# Patient Record
Sex: Male | Born: 1970
Health system: Southern US, Community
[De-identification: ages and names within clinical notes are randomized; demographics above are authoritative.]

## PROBLEM LIST (undated history)

## (undated) DIAGNOSIS — N200 Calculus of kidney: Secondary | ICD-10-CM

## (undated) DIAGNOSIS — T7840XA Allergy, unspecified, initial encounter: Secondary | ICD-10-CM

## (undated) HISTORY — DX: Allergy, unspecified, initial encounter: T78.40XA

---

## 2014-08-13 DIAGNOSIS — N200 Calculus of kidney: Secondary | ICD-10-CM

## 2014-08-13 HISTORY — DX: Calculus of kidney: N20.0

## 2014-08-13 HISTORY — PX: EXTRACORPOREAL SHOCK WAVE LITHOTRIPSY: SHX1557

## 2014-08-26 ENCOUNTER — Emergency Department
Admit: 2014-08-26 | Disposition: A | Discharge status: Home / Self Care | Payer: Self-pay | Admitting: Emergency Medicine

## 2014-08-26 ENCOUNTER — Ambulatory Visit
Admit: 2014-08-26 | Disposition: A | Discharge status: Home / Self Care | Payer: Self-pay | Attending: Family Medicine | Admitting: Family Medicine

## 2014-08-26 LAB — COMPREHENSIVE METABOLIC PANEL
ANION GAP: 4 — AB (ref 7–16)
Albumin: 4.2 g/dL
Alkaline Phosphatase: 67 U/L
BILIRUBIN TOTAL: 1 mg/dL
BUN: 13 mg/dL
CO2: 29 mmol/L
CREATININE: 1.09 mg/dL
Calcium, Total: 8.7 mg/dL — ABNORMAL LOW
Chloride: 106 mmol/L
Glucose: 93 mg/dL
POTASSIUM: 4 mmol/L
SGOT(AST): 29 U/L
SGPT (ALT): 31 U/L
Sodium: 139 mmol/L
Total Protein: 7.6 g/dL

## 2014-08-26 LAB — URINALYSIS, COMPLETE
BACTERIA: NONE SEEN
Bilirubin,UR: NEGATIVE
Glucose,UR: NEGATIVE mg/dL (ref 0–75)
NITRITE: NEGATIVE
PROTEIN: NEGATIVE
Ph: 5 (ref 4.5–8.0)
SPECIFIC GRAVITY: 1.021 (ref 1.003–1.030)

## 2014-08-26 LAB — CBC
HCT: 46.8 % (ref 40.0–52.0)
HGB: 15.2 g/dL (ref 13.0–18.0)
MCH: 28.8 pg (ref 26.0–34.0)
MCHC: 32.5 g/dL (ref 32.0–36.0)
MCV: 89 fL (ref 80–100)
Platelet: 177 10*3/uL (ref 150–440)
RBC: 5.27 10*6/uL (ref 4.40–5.90)
RDW: 13.6 % (ref 11.5–14.5)
WBC: 8.3 10*3/uL (ref 3.8–10.6)

## 2014-08-28 LAB — URINE CULTURE

## 2014-09-02 ENCOUNTER — Ambulatory Visit
Admit: 2014-09-02 | Disposition: A | Discharge status: Home / Self Care | Payer: Self-pay | Attending: Urology | Admitting: Urology

## 2014-10-24 ENCOUNTER — Emergency Department
Admission: EM | Admit: 2014-10-24 | Discharge: 2014-10-24 | Disposition: A | Discharge status: Home / Self Care | Payer: No Typology Code available for payment source | Attending: Emergency Medicine | Admitting: Emergency Medicine

## 2014-10-24 ENCOUNTER — Emergency Department: Payer: No Typology Code available for payment source

## 2014-10-24 ENCOUNTER — Encounter: Payer: Self-pay | Admitting: General Practice

## 2014-10-24 DIAGNOSIS — N200 Calculus of kidney: Secondary | ICD-10-CM | POA: Diagnosis not present

## 2014-10-24 DIAGNOSIS — R1032 Left lower quadrant pain: Secondary | ICD-10-CM | POA: Diagnosis present

## 2014-10-24 HISTORY — DX: Calculus of kidney: N20.0

## 2014-10-24 LAB — COMPREHENSIVE METABOLIC PANEL
ALBUMIN: 4.2 g/dL (ref 3.5–5.0)
ALT: 38 U/L (ref 17–63)
ANION GAP: 9 (ref 5–15)
AST: 38 U/L (ref 15–41)
Alkaline Phosphatase: 65 U/L (ref 38–126)
BUN: 19 mg/dL (ref 6–20)
CALCIUM: 9.4 mg/dL (ref 8.9–10.3)
CHLORIDE: 110 mmol/L (ref 101–111)
CO2: 23 mmol/L (ref 22–32)
Creatinine, Ser: 1.24 mg/dL (ref 0.61–1.24)
GFR calc non Af Amer: >60 mL/min (ref >60)
Glucose, Bld: 128 mg/dL — ABNORMAL HIGH (ref 65–99)
POTASSIUM: 3.7 mmol/L (ref 3.5–5.1)
Sodium: 142 mmol/L (ref 135–145)
Total Bilirubin: 0.7 mg/dL (ref 0.3–1.2)
Total Protein: 7.3 g/dL (ref 6.5–8.1)

## 2014-10-24 LAB — URINALYSIS COMPLETE WITH MICROSCOPIC (ARMC ONLY)
Bacteria, UA: NONE SEEN
Bilirubin Urine: NEGATIVE
Glucose, UA: NEGATIVE mg/dL
KETONES UR: NEGATIVE mg/dL
LEUKOCYTES UA: NEGATIVE
Nitrite: NEGATIVE
PROTEIN: NEGATIVE mg/dL
SQUAMOUS EPITHELIAL / LPF: NONE SEEN
Specific Gravity, Urine: 1.028 (ref 1.005–1.030)
pH: 5 (ref 5.0–8.0)

## 2014-10-24 LAB — CBC
HCT: 47.3 % (ref 40.0–52.0)
HEMOGLOBIN: 15.7 g/dL (ref 13.0–18.0)
MCH: 29.1 pg (ref 26.0–34.0)
MCHC: 33.2 g/dL (ref 32.0–36.0)
MCV: 87.7 fL (ref 80.0–100.0)
PLATELETS: 182 10*3/uL (ref 150–440)
RBC: 5.4 MIL/uL (ref 4.40–5.90)
RDW: 14.3 % (ref 11.5–14.5)
WBC: 8.1 10*3/uL (ref 3.8–10.6)

## 2014-10-24 MED ORDER — OXYCODONE-ACETAMINOPHEN 5-325 MG PO TABS
1.0000 | ORAL_TABLET | Freq: Once | ORAL | Status: AC
  Administered 2014-10-24: 1 via ORAL

## 2014-10-24 MED ORDER — OXYCODONE-ACETAMINOPHEN 5-325 MG PO TABS
ORAL_TABLET | ORAL | Status: AC
  Filled 2014-10-24: qty 1

## 2014-10-24 MED ORDER — OXYCODONE-ACETAMINOPHEN 7.5-325 MG PO TABS: 1.0000 | ORAL_TABLET | Freq: Four times a day (QID) | ORAL | Status: DC | PRN

## 2014-10-24 NOTE — ED Provider Notes (Signed)
[facility] Emergency Department Provider Note  ____________________________________________  Time seen: Approximately 8:01 PM  I have reviewed the triage vital signs and the nursing notes.   HISTORY  Chief Complaint Abdominal Pain and Urinary Retention    HPI Brian Wilcox is a 44 y.o. male patient complaining of sudden onset to the left lower quadrant this afternoon. Patient also notes decreased urine stream at this time. She had strong history of kidney stones. Patient rates his pain as a 10 over 10. Patient denies any nausea vomiting with this complaint. States there is no fever or flank pain at this time   Past Medical History  Diagnosis Date  . Kidney stone     There are no active problems to display for this patient.   History reviewed. No pertinent past surgical history.  No current outpatient prescriptions on file.  Allergies Review of patient's allergies indicates no known allergies.  No family history on file.  Social History History  Substance Use Topics  . Smoking status: Never Smoker   . Smokeless tobacco: Never Used  . Alcohol Use: No    Review of Systems Constitutional: No fever/chills Eyes: No visual changes. ENT: No sore throat. Cardiovascular: Denies chest pain. Respiratory: Denies shortness of breath. Gastrointestinal: No abdominal pain.  No nausea, no vomiting.  No diarrhea.  No constipation. Genitourinary: Decreased urination and left lower quadrant pain Musculoskeletal: Negative for back pain. Skin: Negative for rash. Neurological: Negative for headaches, focal weakness or numbness. 10-point ROS otherwise negative.  ____________________________________________   PHYSICAL EXAM:  VITAL SIGNS: ED Triage Vitals  Enc Vitals Group     BP 10/24/14 1745 150/95 mmHg     Pulse Rate 10/24/14 1745 69     Resp 10/24/14 1745 18     Temp --      Temp Source 10/24/14 1745 Oral     SpO2 10/24/14 1745 99 %      Weight 10/24/14 1745 229 lb (103.874 kg)     Height 10/24/14 1745 6' (1.829 m)     Head Cir --      Peak Flow --      Pain Score 10/24/14 1746 10     Pain Loc --      Pain Edu? --      Excl. in GC? --     Constitutional: Alert and oriented. Well appearing and in mild distress. Eyes: Conjunctivae are normal. PERRL. EOMI. Head: Atraumatic. Nose: No congestion/rhinnorhea. Mouth/Throat: Mucous membranes are moist.  Oropharynx non-erythematous. Neck: No stridor. No deformity for nuchal range of motion nontender palpation. Hematological/Lymphatic/Immunilogical: No cervical lymphadenopathy. Cardiovascular: Normal rate, regular rhythm. Grossly normal heart sounds.  Good peripheral circulation. Mild elevation of blood pressure. Respiratory: Normal respiratory effort.  No retractions. Lungs CTAB. Gastrointestinal: Soft and mild guarding left lower quadrant. No distention. No abdominal bruits. No CVA tenderness. Genitourinary: Hematuria Musculoskeletal: No lower extremity tenderness nor edema.  No joint effusions. Neurologic:  Normal speech and language. No gross focal neurologic deficits are appreciated. Speech is normal. No gait instability. Skin:  Skin is warm, dry and intact. No rash noted. Psychiatric: Mood and affect are normal. Speech and behavior are normal.  ____________________________________________   LABS (all labs ordered are listed, but only abnormal results are displayed)  Labs Reviewed  URINALYSIS COMPLETEWITH MICROSCOPIC ([facility] ONLY) - Abnormal; Notable for the following:    Color, Urine YELLOW (*)    APPearance CLEAR (*)    Hgb urine dipstick 2+ (*)  All other components within normal limits  COMPREHENSIVE METABOLIC PANEL - Abnormal; Notable for the following:    Glucose, Bld 128 (*)    All other components within normal limits  CBC    ____________________________________________  EKG   ____________________________________________  RADIOLOGY   ____________________________________________   PROCEDURES  Procedure(s) performed: None  Critical Care performed: No  ____________________________________________   INITIAL IMPRESSION / ASSESSMENT AND PLAN / ED COURSE  Pertinent labs & imaging results that were available during my care of the patient were reviewed by me and considered in my medical decision making (see chart for details).  Hematuria with CT scan abdomen and pelvis for kidney stone. ____________________________________________   FINAL CLINICAL IMPRESSION(S) / ED DIAGNOSES  Final diagnoses:  Kidney stone on left side      TAMIM SKOG, PA-C 10/24/14 2110  Sharyn Creamer, MD 10/25/14 [phone]

## 2014-10-24 NOTE — Discharge Instructions (Signed)
Kidney Stones °Kidney stones (urolithiasis) are solid masses that form inside your kidneys. The intense pain is caused by the stone moving through the kidney, ureter, bladder, and urethra (urinary tract). When the stone moves, the ureter starts to spasm around the stone. The stone is usually passed in your pee (urine).  °HOME CARE °· Drink enough fluids to keep your pee clear or pale yellow. This helps to get the stone out. °· Strain all pee through the provided strainer. Do not pee without peeing through the strainer, not even once. If you pee the stone out, catch it in the strainer. The stone may be as small as a grain of salt. Take this to your doctor. This will help your doctor figure out what you can do to try to prevent more kidney stones. °· Only take medicine as told by your doctor. °· Follow up with your doctor as told. °· Get follow-up X-rays as told by your doctor. °GET HELP IF: °You have pain that gets worse even if you have been taking pain medicine. °GET HELP RIGHT AWAY IF:  °· Your pain does not get better with medicine. °· You have a fever or shaking chills. °· Your pain increases and gets worse over 18 hours. °· You have new belly (abdominal) pain. °· You feel faint or pass out. °· You are unable to pee. °MAKE SURE YOU:  °· Understand these instructions. °· Will watch your condition. °· Will get help right away if you are not doing well or get worse. °Document Released: 10/17/2007 Document Revised: 12/31/2012 Document Reviewed: 10/01/2012 °[employer]® Patient Information ©2015 [employer]. This information is not intended to replace advice given to you by your health care provider. Make sure you discuss any questions you have with your health care provider. ° °Lithotripsy for Kidney Stones °Lithotripsy is a treatment that can sometimes help eliminate kidney stones and pain that they cause. A form of lithotripsy, also known as extracorporeal shock wave lithotripsy, is a nonsurgical procedure that  helps your body rid itself of the kidney stone when it is too big to pass on its own. Extracorporeal shock wave lithotripsy is a method of crushing a kidney stone with shock waves. These shock waves pass through your body and are focused on your stone. They cause the kidney stones to crumble while still in the urinary tract. It is then easier for the smaller pieces of stone to pass in the urine. °Lithotripsy usually takes about an hour. It is done in a hospital, a lithotripsy center, or a mobile unit. It usually does not require an overnight stay. Your health care provider will instruct you on preparation for the procedure. Your health care provider will tell you what to expect afterward. °LET YOUR HEALTH CARE PROVIDER KNOW ABOUT: °· Any allergies you have. °· All medicines you are taking, including vitamins, herbs, eye drops, creams, and over-the-counter medicines. °· Previous problems you or members of your family have had with the use of anesthetics. °· Any blood disorders you have. °· Previous surgeries you have had. °· Medical conditions you have. °RISKS AND COMPLICATIONS °Generally, lithotripsy for kidney stones is a safe procedure. However, as with any procedure, complications can occur. Possible complications include: °· Infection. °· Bleeding of the kidney. °· Bruising of the kidney or skin. °· Obstruction of the ureter. °· Failure of the stone to fragment. °BEFORE THE PROCEDURE °· Do not eat or drink for 6-8 hours prior to the procedure. You may, however, take the medications   with a sip of water that your physician instructs you to take °· Do not take aspirin or aspirin-containing products for 7 days prior to your procedure °· Do not take nonsteroidal anti-inflammatory products for 7 days prior to your procedure °PROCEDURE °A stent (flexible tube with holes) may be placed in your ureter. The ureter is the tube that transports the urine from the kidneys to the bladder. Your health care provider may place a  stent before the procedure. This will help keep urine flowing from the kidney if the fragments of the stone block the ureter. You may have an IV tube placed in one of your veins to give you fluids and medicines. These medicines may help you relax or make you sleep. During the procedure, you will lie comfortably on a fluid-filled cushion or in a warm-water bath. After an X-ray or ultrasound exam to locate your stone, shock waves are aimed at the stone. If you are awake, you may feel a tapping sensation as the shock waves pass through your body. If large stone particles remain after treatment, a second procedure may be necessary at a later date. °For comfort during the test: °· Relax as much as possible. °· Try to remain still as much as possible. °· Try to follow instructions to speed up the test. °· Let your health care provider know if you are uncomfortable, anxious, or in pain. °AFTER THE PROCEDURE  °After surgery, you will be taken to the recovery area. A nurse will watch and check your progress. Once you're awake, stable, and taking fluids well, you will be allowed to go home as long as there are no problems. You will also be allowed to pass your urine before discharge. You may be given antibiotics to help prevent infection. You may also be prescribed pain medicine if needed. In a week or two, your health care provider may remove your stent, if you have one. You may first have an X-ray exam to check on how successful the fragmentation of your stone has been and how much of the stone has passed. Your health care provider will check to see whether or not stone particles remain. °SEEK IMMEDIATE MEDICAL CARE IF: °· You develop a fever or shaking chills. °· Your pain is not relieved by medicine. °· You feel sick to your stomach (nauseated) and you vomit. °· You develop heavy bleeding. °· You have difficulty urinating. °· You start to pass your stent from your penis. °Document Released: 04/27/2000 Document Revised:  02/18/2013 Document Reviewed: 11/13/2012 °[employer]® Patient Information ©2015 [employer]. This information is not intended to replace advice given to you by your health care provider. Make sure you discuss any questions you have with your health care provider. ° °

## 2014-10-24 NOTE — ED Notes (Signed)
Pt alert and in NAD at time of d/c 

## 2014-10-24 NOTE — ED Notes (Signed)
Pt. Arrived to ed from home with reports of sudden onset of LLQ pain that started suddenly this afternoon. Pt has hx of kidney stones. PT states "i think i might have another one". Pt describes decrease urination at this time. Alert and Oriented. Diaphoretic in triage.

## 2014-12-01 ENCOUNTER — Encounter: Payer: Self-pay | Admitting: *Deleted

## 2014-12-02 ENCOUNTER — Encounter
Admission: RE | Disposition: A | Discharge status: Home / Self Care | Payer: Self-pay | Source: Ambulatory Visit | Attending: Urology

## 2014-12-02 ENCOUNTER — Ambulatory Visit
Admission: RE | Admit: 2014-12-02 | Discharge: 2014-12-02 | Disposition: A | Discharge status: Home / Self Care | Payer: No Typology Code available for payment source | Source: Ambulatory Visit | Attending: Urology | Admitting: Urology

## 2014-12-02 ENCOUNTER — Encounter: Payer: Self-pay | Admitting: *Deleted

## 2014-12-02 DIAGNOSIS — Z79899 Other long term (current) drug therapy: Secondary | ICD-10-CM | POA: Insufficient documentation

## 2014-12-02 DIAGNOSIS — N201 Calculus of ureter: Secondary | ICD-10-CM | POA: Insufficient documentation

## 2014-12-02 DIAGNOSIS — N200 Calculus of kidney: Secondary | ICD-10-CM

## 2014-12-02 HISTORY — PX: EXTRACORPOREAL SHOCK WAVE LITHOTRIPSY: SHX1557

## 2014-12-02 SURGERY — LITHOTRIPSY, ESWL
Anesthesia: Moderate Sedation | Laterality: Left

## 2014-12-02 MED ORDER — PROMETHAZINE HCL 25 MG/ML IJ SOLN
INTRAMUSCULAR | Status: AC
Start: 2014-12-02 — End: 2014-12-02
  Administered 2014-12-02: 25 mg via INTRAMUSCULAR
  Filled 2014-12-02: qty 1

## 2014-12-02 MED ORDER — DEXTROSE-NACL 5-0.45 % IV SOLN
INTRAVENOUS | Status: DC
  Administered 2014-12-02: 08:00:00 via INTRAVENOUS

## 2014-12-02 MED ORDER — DIPHENHYDRAMINE HCL 25 MG PO CAPS
25.0000 mg | ORAL_CAPSULE | ORAL | Status: AC
  Administered 2014-12-02: 25 mg via ORAL

## 2014-12-02 MED ORDER — LEVOFLOXACIN 500 MG PO TABS
ORAL_TABLET | ORAL | Status: AC
Start: 2014-12-02 — End: 2014-12-02
  Administered 2014-12-02: 500 mg via ORAL
  Filled 2014-12-02: qty 1

## 2014-12-02 MED ORDER — FUROSEMIDE 10 MG/ML IJ SOLN
INTRAMUSCULAR | Status: AC
  Administered 2014-12-02: 10 mg
  Filled 2014-12-02: qty 2

## 2014-12-02 MED ORDER — MIDAZOLAM HCL 2 MG/2ML IJ SOLN
INTRAMUSCULAR | Status: AC
  Administered 2014-12-02: 1 mg via INTRAMUSCULAR
  Filled 2014-12-02: qty 2

## 2014-12-02 MED ORDER — MORPHINE SULFATE 10 MG/ML IJ SOLN
INTRAMUSCULAR | Status: AC
  Administered 2014-12-02: 10 mg via INTRAMUSCULAR
  Filled 2014-12-02: qty 1

## 2014-12-02 MED ORDER — DIPHENHYDRAMINE HCL 25 MG PO CAPS
ORAL_CAPSULE | ORAL | Status: AC
  Administered 2014-12-02: 25 mg via ORAL
  Filled 2014-12-02: qty 1

## 2014-12-02 MED ORDER — NUCYNTA 50 MG PO TABS: 50.0000 mg | ORAL_TABLET | Freq: Four times a day (QID) | ORAL | Status: DC | PRN

## 2014-12-02 MED ORDER — TAMSULOSIN HCL 0.4 MG PO CAPS: 0.4000 mg | ORAL_CAPSULE | Freq: Every day | ORAL | Status: DC

## 2014-12-02 MED ORDER — MORPHINE SULFATE 10 MG/ML IJ SOLN
10.0000 mg | Freq: Once | INTRAMUSCULAR | Status: AC
  Administered 2014-12-02: 10 mg via INTRAMUSCULAR

## 2014-12-02 MED ORDER — LEVOFLOXACIN 500 MG PO TABS
500.0000 mg | ORAL_TABLET | ORAL | Status: AC
  Administered 2014-12-02: 500 mg via ORAL

## 2014-12-02 MED ORDER — LEVOFLOXACIN 500 MG PO TABS: 500.0000 mg | ORAL_TABLET | Freq: Every day | ORAL | Status: DC

## 2014-12-02 MED ORDER — PROMETHAZINE HCL 25 MG/ML IJ SOLN: 25.0000 mg | Freq: Once | INTRAMUSCULAR | Status: DC

## 2014-12-02 MED ORDER — MIDAZOLAM HCL 2 MG/2ML IJ SOLN
1.0000 mg | Freq: Once | INTRAMUSCULAR | Status: AC
  Administered 2014-12-02: 1 mg via INTRAMUSCULAR

## 2014-12-02 MED ORDER — PROMETHAZINE HCL 25 MG/ML IJ SOLN
25.0000 mg | Freq: Once | INTRAMUSCULAR | Status: AC
  Administered 2014-12-02: 25 mg via INTRAMUSCULAR

## 2014-12-02 MED ORDER — ONDANSETRON 8 MG PO TBDP: 8.0000 mg | ORAL_TABLET | Freq: Four times a day (QID) | ORAL | Status: DC | PRN

## 2014-12-02 NOTE — Discharge Instructions (Addendum)
Dietary Guidelines to Help Prevent Kidney Stones Your risk of kidney stones can be decreased by adjusting the foods you eat. The most important thing you can do is drink enough fluid. You should drink enough fluid to keep your urine clear or pale yellow. The following guidelines provide specific information for the type of kidney stone you have had. GUIDELINES ACCORDING TO TYPE OF KIDNEY STONE Calcium Oxalate Kidney Stones  Reduce the amount of salt you eat. Foods that have a lot of salt cause your body to release excess calcium into your urine. The excess calcium can combine with a substance called oxalate to form kidney stones.  Reduce the amount of animal protein you eat if the amount you eat is excessive. Animal protein causes your body to release excess calcium into your urine. Ask your dietitian how much protein from animal sources you should be eating.  Avoid foods that are high in oxalates. If you take vitamins, they should have less than 500 mg of vitamin C. Your body turns vitamin C into oxalates. You do not need to avoid fruits and vegetables high in vitamin C. Calcium Phosphate Kidney Stones  Reduce the amount of salt you eat to help prevent the release of excess calcium into your urine.  Reduce the amount of animal protein you eat if the amount you eat is excessive. Animal protein causes your body to release excess calcium into your urine. Ask your dietitian how much protein from animal sources you should be eating.  Get enough calcium from food or take a calcium supplement (ask your dietitian for recommendations). Food sources of calcium that do not increase your risk of kidney stones include:  Broccoli.  Dairy products, such as cheese and yogurt.  Pudding. Uric Acid Kidney Stones  Do not have more than 6 oz of animal protein per day. FOOD SOURCES Animal Protein Sources  Meat (all types).  Poultry.  Eggs.  Fish, seafood. Foods High in [employer] seasonings.  Soy  sauce.  Teriyaki sauce.  Cured and processed meats.  Salted crackers and snack foods.  Fast food.  Canned soups and most canned foods. Foods High in Oxalates  Grains:  Amaranth.  Barley.  Grits.  Wheat germ.  Bran.  Buckwheat flour.  All bran cereals.  Pretzels.  Whole wheat bread.  Vegetables:  Beans (wax).  Beets and beet greens.  Collard greens.  Eggplant.  [place]scarole.  Leeks.  Okra.  Parsley.  Rutabagas.  Spinach.  Swiss chard.  Tomato paste.  Fried potatoes.  Sweet potatoes.  Fruits:  Red currants.  Figs.  Kiwi.  Rhubarb.  Meat and Other Protein Sources:  Beans (dried).  Soy burgers and other soybean products.  Miso.  Nuts (peanuts, almonds, pecans, cashews, hazelnuts).  Nut butters.  Sesame seeds and tahini (paste made of sesame seeds).  Poppy seeds.  Beverages:  Chocolate drink mixes.  Soy milk.  Instant iced tea.  Juices made from high-oxalate fruits or vegetables.  Other:  Carob.  Chocolate.  Fruitcake.  Marmalades. Document Released: 08/25/2010 Document Revised: 05/05/2013 Document Reviewed: 03/27/2013 [facility] Patient Information 2015 [place], [place]. This information is not intended to replace advice given to you by your health care provider. Make sure you discuss any questions you have with your health care provider.  Kidney Stones Kidney stones (urolithiasis) are solid masses that form inside your kidneys. The intense pain is caused by the stone moving through the kidney, ureter, bladder, and urethra (urinary tract). When the stone moves, the  ureter starts to spasm around the stone. The stone is usually passed in your pee (urine).  HOME CARE  Drink enough fluids to keep your pee clear or pale yellow. This helps to get the stone out.  Strain all pee through the provided strainer. Do not pee without peeing through the strainer, not even once. If you pee the stone out, catch it in the  strainer. The stone may be as small as a grain of salt. Take this to your doctor. This will help your doctor figure out what you can do to try to prevent more kidney stones.  Only take medicine as told by your doctor.  Follow up with your doctor as told.  Get follow-up X-rays as told by your doctor. GET HELP IF: You have pain that gets worse even if you have been taking pain medicine. GET HELP RIGHT AWAY IF:   Your pain does not get better with medicine.  You have a fever or shaking chills.  Your pain increases and gets worse over 18 hours.  You have new belly (abdominal) pain.  You feel faint or pass out.  You are unable to pee. MAKE SURE YOU:   Understand these instructions.  Will watch your condition.  Will get help right away if you are not doing well or get worse. Document Released: 10/17/2007 Document Revised: 12/31/2012 Document Reviewed: 10/01/2012 [facility] Patient Information 2015 [place], [place]. This information is not intended to replace advice given to you by your health care provider. Make sure you discuss any questions you have with your health care provider.  Kidney Stones Kidney stones (urolithiasis) are deposits that form inside your kidneys. The intense pain is caused by the stone moving through the urinary tract. When the stone moves, the ureter goes into spasm around the stone. The stone is usually passed in the urine.  CAUSES   A disorder that makes certain neck glands produce too much parathyroid hormone (primary hyperparathyroidism).  A buildup of uric acid crystals, similar to gout in your joints.  Narrowing (stricture) of the ureter.  A kidney obstruction present at birth (congenital obstruction).  Previous surgery on the kidney or ureters.  Numerous kidney infections. SYMPTOMS   Feeling sick to your stomach (nauseous).  Throwing up (vomiting).  Blood in the urine (hematuria).  Pain that usually spreads (radiates) to the  groin.  Frequency or urgency of urination. DIAGNOSIS   Taking a history and physical exam.  Blood or urine tests.  CT scan.  Occasionally, an examination of the inside of the urinary bladder (cystoscopy) is performed. TREATMENT   Observation.  Increasing your fluid intake.  Extracorporeal shock wave lithotripsy--This is a noninvasive procedure that uses shock waves to break up kidney stones.  Surgery may be needed if you have severe pain or persistent obstruction. There are various surgical procedures. Most of the procedures are performed with the use of small instruments. Only small incisions are needed to accommodate these instruments, so recovery time is minimized. The size, location, and chemical composition are all important variables that will determine the proper choice of action for you. Talk to your health care provider to better understand your situation so that you will minimize the risk of injury to yourself and your kidney.  HOME CARE INSTRUCTIONS   Drink enough water and fluids to keep your urine clear or pale yellow. This will help you to pass the stone or stone fragments.  Strain all urine through the provided strainer. Keep all particulate matter and  stones for your health care provider to see. The stone causing the pain may be as small as a grain of salt. It is very important to use the strainer each and every time you pass your urine. The collection of your stone will allow your health care provider to analyze it and verify that a stone has actually passed. The stone analysis will often identify what you can do to reduce the incidence of recurrences.  Only take over-the-counter or prescription medicines for pain, discomfort, or fever as directed by your health care provider.  Make a follow-up appointment with your health care provider as directed.  Get follow-up X-rays if required. The absence of pain does not always mean that the stone has passed. It may have only  stopped moving. If the urine remains completely obstructed, it can cause loss of kidney function or even complete destruction of the kidney. It is your responsibility to make sure X-rays and follow-ups are completed. Ultrasounds of the kidney can show blockages and the status of the kidney. Ultrasounds are not associated with any radiation and can be performed easily in a matter of minutes. SEEK MEDICAL CARE IF:  You experience pain that is progressive and unresponsive to any pain medicine you have been prescribed. SEEK IMMEDIATE MEDICAL CARE IF:   Pain cannot be controlled with the prescribed medicine.  You have a fever or shaking chills.  The severity or intensity of pain increases over 18 hours and is not relieved by pain medicine.  You develop a new onset of abdominal pain.  You feel faint or pass out.  You are unable to urinate. MAKE SURE YOU:   Understand these instructions.  Will watch your condition.  Will get help right away if you are not doing well or get worse. Document Released: 04/30/2005 Document Revised: 12/31/2012 Document Reviewed: 10/01/2012 [facility] Patient Information 2015 [place], [place]. This information is not intended to replace advice given to you by your health care provider. Make sure you discuss any questions you have with your health care provider.  Lithotripsy for Kidney Stones Lithotripsy is a treatment that can sometimes help eliminate kidney stones and pain that they cause. A form of lithotripsy, also known as extracorporeal shock wave lithotripsy, is a nonsurgical procedure that helps your body rid itself of the kidney stone when it is too big to pass on its own. Extracorporeal shock wave lithotripsy is a method of crushing a kidney stone with shock waves. These shock waves pass through your body and are focused on your stone. They cause the kidney stones to crumble while still in the urinary tract. It is then easier for the smaller pieces of stone to  pass in the urine. Lithotripsy usually takes about an hour. It is done in a hospital, a lithotripsy center, or a mobile unit. It usually does not require an overnight stay. Your health care provider will instruct you on preparation for the procedure. Your health care provider will tell you what to expect afterward. LET [facility] CARE PROVIDER KNOW ABOUT:  Any allergies you have.  All medicines you are taking, including vitamins, herbs, eye drops, creams, and over-the-counter medicines.  Previous problems you or members of your family have had with the use of anesthetics.  Any blood disorders you have.  Previous surgeries you have had.  Medical conditions you have. RISKS AND COMPLICATIONS Generally, lithotripsy for kidney stones is a safe procedure. However, as with any procedure, complications can occur. Possible complications include:  Infection.  Bleeding of the kidney.  Bruising of the kidney or skin.  Obstruction of the ureter.  Failure of the stone to fragment. BEFORE THE PROCEDURE  Do not eat or drink for 6-8 hours prior to the procedure. You may, however, take the medications with a sip of water that your physician instructs you to take  Do not take aspirin or aspirin-containing products for 7 days prior to your procedure  Do not take nonsteroidal anti-inflammatory products for 7 days prior to your procedure PROCEDURE A stent (flexible tube with holes) may be placed in your ureter. The ureter is the tube that transports the urine from the kidneys to the bladder. Your health care provider may place a stent before the procedure. This will help keep urine flowing from the kidney if the fragments of the stone block the ureter. You may have an IV tube placed in one of your veins to give you fluids and medicines. These medicines may help you relax or make you sleep. During the procedure, you will lie comfortably on a fluid-filled cushion or in a warm-water bath. After an X-ray  or ultrasound exam to locate your stone, shock waves are aimed at the stone. If you are awake, you may feel a tapping sensation as the shock waves pass through your body. If large stone particles remain after treatment, a second procedure may be necessary at a later date. For comfort during the test:  Relax as much as possible.  Try to remain still as much as possible.  Try to follow instructions to speed up the test.  Let your health care provider know if you are uncomfortable, anxious, or in pain. AFTER THE PROCEDURE  After surgery, you will be taken to the recovery area. A nurse will watch and check your progress. Once you're awake, stable, and taking fluids well, you will be allowed to go home as long as there are no problems. You will also be allowed to pass your urine before discharge.You may be given antibiotics to help prevent infection. You may also be prescribed pain medicine if needed. In a week or two, your health care provider may remove your stent, if you have one. You may first have an X-ray exam to check on how successful the fragmentation of your stone has been and how much of the stone has passed. Your health care provider will check to see whether or not stone particles remain. SEEK IMMEDIATE MEDICAL CARE IF:  You develop a fever or shaking chills.  Your pain is not relieved by medicine.  You feel sick to your stomach (nauseated) and you vomit.  You develop heavy bleeding.  You have difficulty urinating.  You start to pass your stent from your penis. Document Released: 04/27/2000 Document Revised: 02/18/2013 Document Reviewed: 11/13/2012 [facility] Patient Information 2015 [place], [place]. This information is not intended to replace advice given to you by your health care provider. Make sure you discuss any questions you have with your health care provider.  AMBULATORY SURGERY  DISCHARGE INSTRUCTIONS   1) The drugs that you were given will stay in your system until  tomorrow so for the next 24 hours you should not:  A) Drive an automobile B) Make any legal decisions C) Drink any alcoholic beverage   2) You may resume regular meals tomorrow.  Today it is better to start with liquids and gradually work up to solid foods.  You may eat anything you prefer, but it is better to start with liquids, then soup  and crackers, and gradually work up to solid foods.   3) Please notify your doctor immediately if you have any unusual bleeding, trouble breathing, redness and pain at the surgery site, drainage, fever, or pain not relieved by medication.    4) Additional Instructions:   Please contact your physician with any problems or Same Day Surgery at [phone], Monday through Friday 6 am to 4 pm, or [place] at [facility] number at [phone].

## 2014-12-02 NOTE — H&P (Signed)
  Date of Initial H&P: 12/01/14  History reviewed, patient examined, no change in status, stable for surgery.

## 2015-01-21 ENCOUNTER — Encounter: Payer: Self-pay | Admitting: Urology

## 2016-01-31 DIAGNOSIS — N23 Unspecified renal colic: Secondary | ICD-10-CM | POA: Diagnosis not present

## 2016-01-31 DIAGNOSIS — N201 Calculus of ureter: Secondary | ICD-10-CM | POA: Diagnosis not present

## 2016-02-14 DIAGNOSIS — N201 Calculus of ureter: Secondary | ICD-10-CM | POA: Diagnosis not present

## 2016-03-31 IMAGING — CT CT ABD-PELV W/O CM
1 of 2 series · 6 of 32 positions shown, 11 images · non-contrast
Comparison: None.

CLINICAL DATA: Acute left upper quadrant abdominal pain.

EXAM:
CT ABDOMEN AND PELVIS WITHOUT CONTRAST
TECHNIQUE: Multidetector CT imaging of the abdomen and pelvis was performed
following the standard protocol without IV contrast.

[Series 4: lung windows · axial · 0.77mm/px · z∈[-100,+24]mm · 6 of 35 slices shown, 11 images]
[im 5/35  soft-tissue]
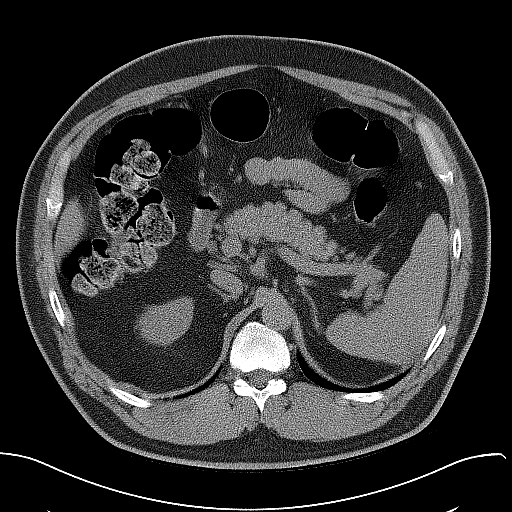
[im 5/35  bone]
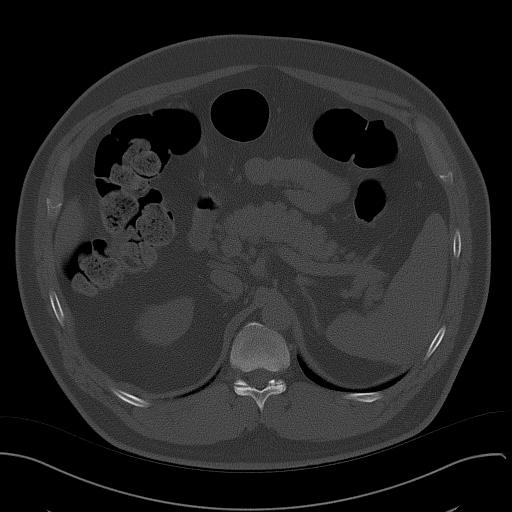
[im 10/35  soft-tissue]
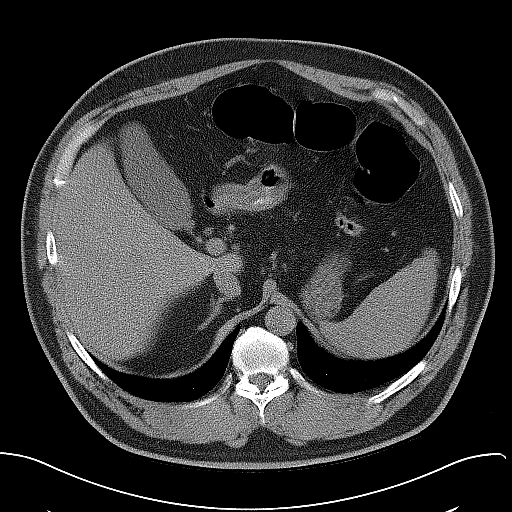
[im 15/35  soft-tissue]
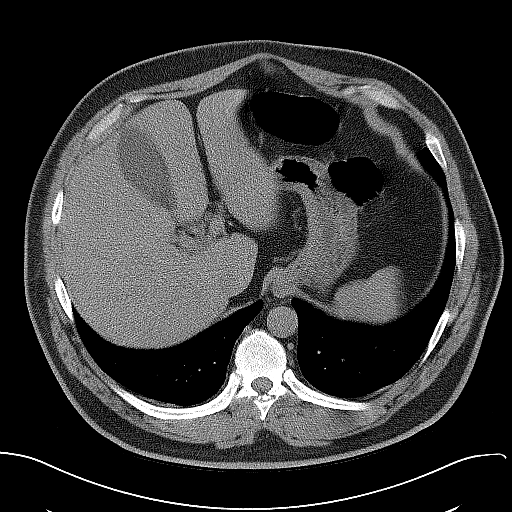
[im 15/35  lung]
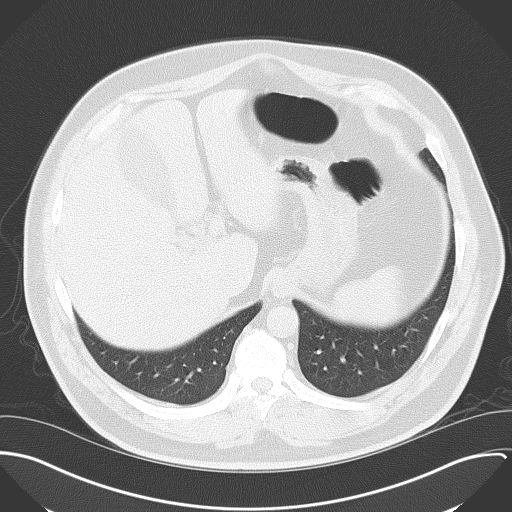
[im 20/35  soft-tissue]
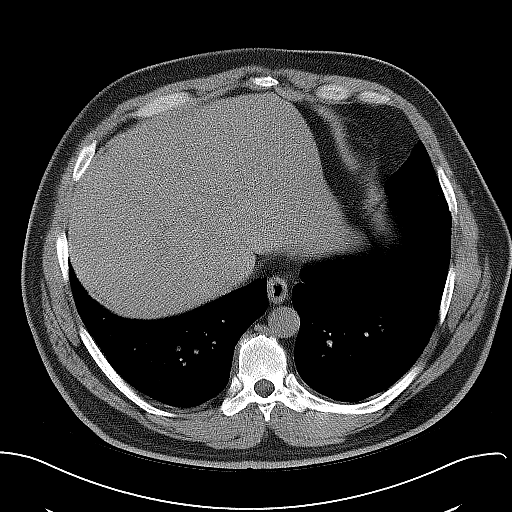
[im 20/35  lung]
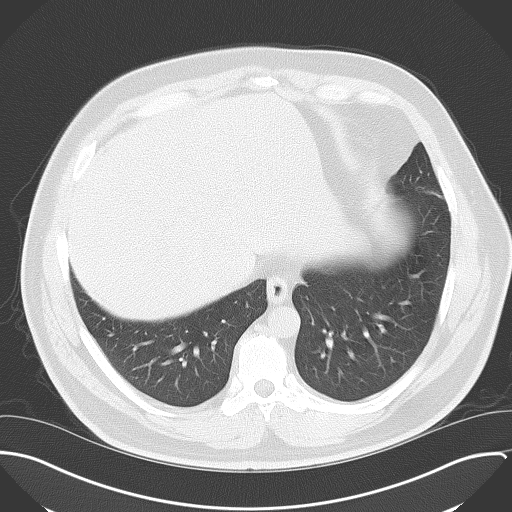
[im 25/35  soft-tissue]
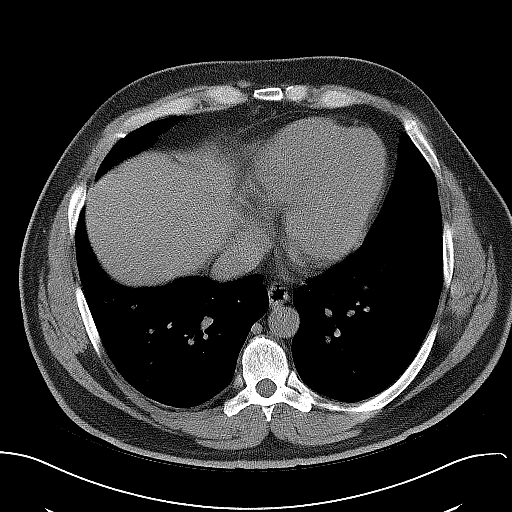
[im 25/35  lung]
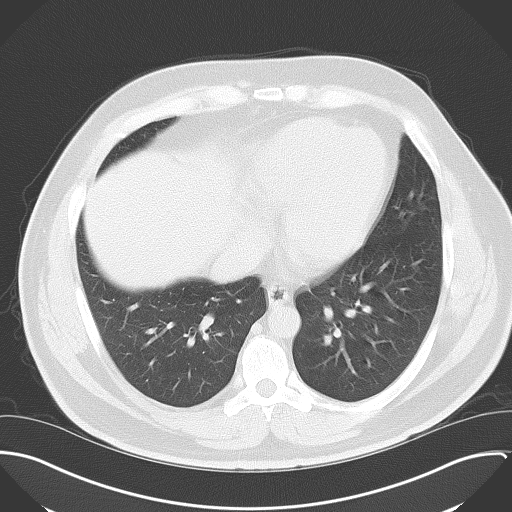
[im 30/35  soft-tissue]
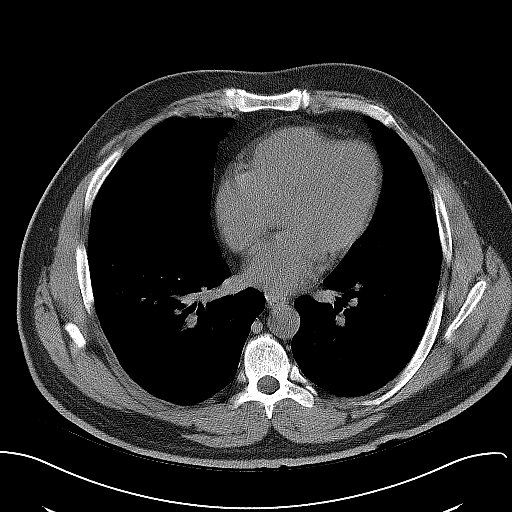
[im 30/35  lung]
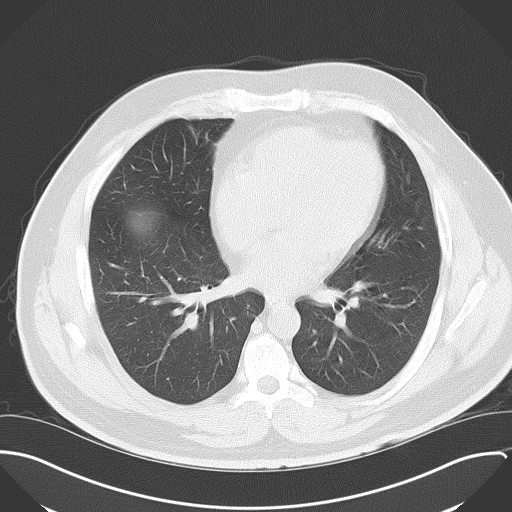

[6 of 32 positions shown; findings below may reference images not displayed]

FINDINGS: Visualized lung bases appear normal. No significant osseous
abnormality is noted.

No gallstones are noted. No focal abnormality is noted in the liver,
spleen or pancreas. Adrenal glands appear normal. Right kidney and
ureter appear normal. Mild left hydronephrosis is noted secondary to
12 x 5 mm calculus at left ureteropelvic junction. There is no
evidence of bowel obstruction. No abnormal fluid collection is
noted. Urinary bladder appears normal. No significant adenopathy is
noted.
IMPRESSION: Mild left hydronephrosis secondary to 12 x 5 mm calculus at left
ureteropelvic junction.

## 2017-05-30 ENCOUNTER — Ambulatory Visit (INDEPENDENT_AMBULATORY_CARE_PROVIDER_SITE_OTHER): Payer: Self-pay | Admitting: Emergency Medicine

## 2017-05-30 VITALS — BP 141/90 | HR 87 | Temp 98.4°F | Wt 245.0 lb

## 2017-05-30 DIAGNOSIS — J069 Acute upper respiratory infection, unspecified: Secondary | ICD-10-CM

## 2017-05-30 DIAGNOSIS — B9789 Other viral agents as the cause of diseases classified elsewhere: Secondary | ICD-10-CM

## 2017-05-30 MED ORDER — LORATADINE-PSEUDOEPHEDRINE ER 10-240 MG PO TB24: 1.0000 | ORAL_TABLET | Freq: Every day | ORAL | 0 refills | Status: AC

## 2017-05-30 MED ORDER — IPRATROPIUM BROMIDE 0.03 % NA SOLN: 2.0000 | Freq: Four times a day (QID) | NASAL | 1 refills | Status: AC

## 2017-05-30 MED ORDER — HYDROCODONE-HOMATROPINE 5-1.5 MG/5ML PO SYRP: 5.0000 mL | ORAL_SOLUTION | Freq: Four times a day (QID) | ORAL | 0 refills | Status: AC | PRN

## 2017-05-30 NOTE — Patient Instructions (Signed)

## 2017-05-30 NOTE — Progress Notes (Signed)
Subjective:     Brian Wilcox is a 47 y.o. male who presents for evaluation of symptoms of a URI. Symptoms include congestion, coryza, cough described as productive, nasal congestion, post nasal drip, productive cough with  green colored sputum, sinus pressure and sneezing. Onset of symptoms was 3 days ago, and has been unchanged since that time. Treatment to date: none.  The following portions of the patient's history were reviewed and updated as appropriate: allergies and current medications.  Review of Systems Pertinent items noted in HPI and remainder of comprehensive ROS otherwise negative.   Objective:    BP (!) 141/90   Pulse 87   Temp 98.4 F (36.9 C)   Wt 245 lb (111.1 kg)   SpO2 96%  General appearance: alert, cooperative and appears stated age Head: Normocephalic, without obvious abnormality, atraumatic Eyes: negative Ears: normal TM's and external ear canals both ears Nose: moderate congestion, turbinates pink, edematous, no sinus tenderness Throat: lips, mucosa, and tongue normal; teeth and gums normal Lungs: clear to auscultation bilaterally Heart: regular rate and rhythm Abdomen: soft, non-tender Pulses: 2+ and symmetric Skin: Skin color, texture, turgor normal. No rashes or lesions   Assessment:    viral upper respiratory illness   Plan:    Discussed diagnosis and treatment of URI. Discussed the importance of avoiding unnecessary antibiotic therapy. Suggested symptomatic OTC remedies. Nasal saline spray for congestion. Follow up as needed. Hycodan for cough, counseling provided to avoid driving, operating heavy machinary, or taking sedative medications while taking this medicine

## 2019-02-17 ENCOUNTER — Other Ambulatory Visit: Payer: Self-pay

## 2019-02-17 ENCOUNTER — Ambulatory Visit (INDEPENDENT_AMBULATORY_CARE_PROVIDER_SITE_OTHER): Payer: No Typology Code available for payment source | Admitting: Physician Assistant

## 2019-02-17 ENCOUNTER — Encounter: Payer: Self-pay | Admitting: Physician Assistant

## 2019-02-17 VITALS — BP 135/92 | HR 82 | Temp 96.8°F | Resp 16 | Ht 72.0 in | Wt 248.0 lb

## 2019-02-17 DIAGNOSIS — Z0000 Encounter for general adult medical examination without abnormal findings: Secondary | ICD-10-CM | POA: Diagnosis not present

## 2019-02-17 DIAGNOSIS — Z23 Encounter for immunization: Secondary | ICD-10-CM | POA: Diagnosis not present

## 2019-02-17 DIAGNOSIS — J309 Allergic rhinitis, unspecified: Secondary | ICD-10-CM

## 2019-02-17 DIAGNOSIS — N200 Calculus of kidney: Secondary | ICD-10-CM | POA: Diagnosis not present

## 2019-02-17 DIAGNOSIS — Z114 Encounter for screening for human immunodeficiency virus [HIV]: Secondary | ICD-10-CM | POA: Diagnosis not present

## 2019-02-17 DIAGNOSIS — E669 Obesity, unspecified: Secondary | ICD-10-CM

## 2019-02-17 DIAGNOSIS — Z6833 Body mass index (BMI) 33.0-33.9, adult: Secondary | ICD-10-CM

## 2019-02-17 NOTE — Progress Notes (Signed)
Patient: Brian Wilcox, Male    DOB: [birth year], 48 y.o.   MRN: [id number] Visit Date: 02/17/2019  Today's Provider: Trey SailorsAdriana M Pollak, PA-C   Chief Complaint  Patient presents with  . New Patient (Initial Visit)   Subjective:     Annual physical exam Brian MaladyChristopher B Wilcox is a 48 y.o. male who presents today for health maintenance and complete physical. He feels well. He reports exercising some. He reports he is sleeping well.  Lives with wife of 14 years and son [id number] and daughter age 516 in [place], [place].   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   Bps at home, unsure of top number and bottom is 79.   BP Readings from Last 3 Encounters:  02/17/19 (!) 135/92  12/02/14 126/78  10/24/14 (!) 152/95   Allergies: previously on shots. Currently on Allegra but feels his allergies are currenlty uncontrolled and he would like to see about allergy shots again. These were previously cost prohibitive. Saw Dr. Andee [facility]Vaught at [facility].   No family history of colon cancer or prostate cancer.   Tetanus shot: Does not remember, would like to update today. Flu shot: Due today.   BMI Readings from Last 5 Encounters:  02/17/19 33.63 kg/m  12/02/14 31.06 kg/m  10/24/14 31.06 kg/m    -----------------------------------------------------------------   Review of Systems  Constitutional: Negative.   HENT: Negative.   Eyes: Negative.   Respiratory: Negative.   Cardiovascular: Negative.   Gastrointestinal: Negative.   Endocrine: Negative.   Genitourinary: Negative.   Musculoskeletal: Negative.   Skin: Negative.   Allergic/Immunologic: Negative.   Neurological: Negative.   Hematological: Negative.   Psychiatric/Behavioral: Negative.     Social History      He  reports that he has never smoked. He has never used smokeless tobacco. He reports that he does not drink alcohol or use drugs.       Social History   Socioeconomic History  . Marital status:  Married    Spouse name: Not on file  . Number of children: Not on file  . Years of education: Not on file  . Highest education level: Not on file  Occupational History  . Not on file  Social Needs  . Financial resource strain: Not on file  . Food insecurity    Worry: Not on file    Inability: Not on file  . Transportation needs    Medical: Not on file    Non-medical: Not on file  Tobacco Use  . Smoking status: Never Smoker  . Smokeless tobacco: Never Used  Substance and Sexual Activity  . Alcohol use: No  . Drug use: No  . Sexual activity: Not on file  Lifestyle  . Physical activity    Days per week: Not on file    Minutes per session: Not on file  . Stress: Not on file  Relationships  . Social Musicianconnections    Talks on phone: Not on file    Gets together: Not on file    Attends religious service: Not on file    Active member of club or organization: Not on file    Attends meetings of clubs or organizations: Not on file    Relationship status: Not on file  Other Topics Concern  . Not on file  Social History Narrative  . Not on file    Past Medical History:  Diagnosis Date  . Allergy   . Kidney stone  08/2014   ESWL     There are no active problems to display for this patient.   Past Surgical History:  Procedure Laterality Date  . EXTRACORPOREAL SHOCK WAVE LITHOTRIPSY  08/2014  . EXTRACORPOREAL SHOCK WAVE LITHOTRIPSY Left 12/02/2014   Procedure: EXTRACORPOREAL SHOCK WAVE LITHOTRIPSY (ESWL);  Surgeon: Royston Cowper, MD;  Location: [facility] ORS;  Service: Urology;  Laterality: Left;    Family History        Family Status  Relation Name Status  . Mother  Alive  . Father  Alive  . Daughter  Alive  . Son  Alive        His family history includes Anorexia nervosa in his mother.      No Known Allergies   Current Outpatient Medications:  .  fexofenadine (ALLEGRA) 180 MG tablet, Take 180 mg by mouth daily., Disp: , Rfl:  .  Multiple Vitamin (MULTIVITAMIN)  tablet, Take 1 tablet by mouth daily., Disp: , Rfl:    Patient Care Team: Paulene Floor as PCP - General (Physician Assistant)    Objective:    Vitals: BP (!) 135/92 (BP Location: Right Arm, Patient Position: Sitting, Cuff Size: Large)   Pulse 82   Temp (!) 96.8 F (36 C) (Temporal)   Resp 16   Ht 6' (1.829 m)   Wt 248 lb (112.5 kg)   BMI 33.63 kg/m    Vitals:   02/17/19 0835  BP: (!) 135/92  Pulse: 82  Resp: 16  Temp: (!) 96.8 F (36 C)  TempSrc: Temporal  Weight: 248 lb (112.5 kg)  Height: 6' (1.829 m)     Physical Exam Constitutional:      Appearance: Normal appearance. He is obese.  HENT:     Right Ear: Tympanic membrane and ear canal normal.     Left Ear: Tympanic membrane and ear canal normal.  Neck:     Musculoskeletal: Normal range of motion and neck supple.  Cardiovascular:     Rate and Rhythm: Normal rate and regular rhythm.     Heart sounds: Normal heart sounds.  Pulmonary:     Effort: Pulmonary effort is normal.     Breath sounds: Normal breath sounds.  Abdominal:     General: Bowel sounds are normal.     Palpations: Abdomen is soft.  Lymphadenopathy:     Cervical: No cervical adenopathy.  Skin:    General: Skin is warm and dry.  Neurological:     Mental Status: He is alert and oriented to person, place, and time. Mental status is at baseline.  Psychiatric:        Mood and Affect: Mood normal.        Behavior: Behavior normal.      Depression Screen PHQ 2/9 Scores 02/17/2019  PHQ - 2 Score 0  PHQ- 9 Score 0       Assessment & Plan:     Routine Health Maintenance and Physical Exam  Exercise Activities and Dietary recommendations Goals   None      There is no immunization history on file for this patient.  Health Maintenance  Topic Date Due  . HIV Screening  12/02/1984  . TETANUS/TDAP  12/02/1988  . INFLUENZA VACCINE  12/13/2018     Discussed health benefits of physical activity, and encouraged him to engage  in regular exercise appropriate for his age and condition.    1. Annual physical exam  - Comprehensive Metabolic Panel (CMET) - CBC with Differential -  Lipid Profile - TSH  2. Allergic rhinitis, unspecified seasonality, unspecified trigger  - Ambulatory referral to ENT  3. Kidney stones  - Ambulatory referral to Urology  4. Encounter for screening for HIV  - HIV antibody (with reflex)  5. Need for influenza vaccination  - Flu Vaccine QUAD 36+ mos IM  6. Need for Tdap vaccination  - Tdap vaccine greater than or equal to 7yo IM  7. Class 1 obesity without serious comorbidity with body mass index (BMI) of 33.0 to 33.9 in adult, unspecified obesity type  Counseled on increased exercise and reducing calorie intake.   The entirety of the information documented in the History of Present Illness, Review of Systems and Physical Exam were personally obtained by me. Portions of this information were initially documented by Rondel Baton, CMA and reviewed by me for thoroughness and accuracy.   F/u 1 year CPE   --------------------------------------------------------------------    Trey Sailors, PA-C  [facility]

## 2019-02-17 NOTE — Patient Instructions (Signed)

## 2019-02-18 ENCOUNTER — Telehealth: Payer: Self-pay

## 2019-02-18 ENCOUNTER — Encounter: Payer: Self-pay | Admitting: Physician Assistant

## 2019-02-18 DIAGNOSIS — J309 Allergic rhinitis, unspecified: Secondary | ICD-10-CM

## 2019-02-18 LAB — CBC WITH DIFFERENTIAL/PLATELET
Basophils Absolute: 0 10*3/uL (ref 0.0–0.2)
Basos: 0 %
EOS (ABSOLUTE): 0.1 10*3/uL (ref 0.0–0.4)
Eos: 1 %
Hematocrit: 46.7 % (ref 37.5–51.0)
Hemoglobin: 15.5 g/dL (ref 13.0–17.7)
Immature Grans (Abs): 0 10*3/uL (ref 0.0–0.1)
Immature Granulocytes: 0 %
Lymphocytes Absolute: 1.2 10*3/uL (ref 0.7–3.1)
Lymphs: 26 %
MCH: 29.4 pg (ref 26.6–33.0)
MCHC: 33.2 g/dL (ref 31.5–35.7)
MCV: 88 fL (ref 79–97)
Monocytes Absolute: 0.4 10*3/uL (ref 0.1–0.9)
Monocytes: 8 %
Neutrophils Absolute: 2.9 10*3/uL (ref 1.4–7.0)
Neutrophils: 65 %
Platelets: 190 10*3/uL (ref 150–450)
RBC: 5.28 x10E6/uL (ref 4.14–5.80)
RDW: 13.1 % (ref 11.6–15.4)
WBC: 4.5 10*3/uL (ref 3.4–10.8)

## 2019-02-18 LAB — COMPREHENSIVE METABOLIC PANEL
ALT: 41 IU/L (ref 0–44)
AST: 36 IU/L (ref 0–40)
Albumin/Globulin Ratio: 1.5 (ref 1.2–2.2)
Albumin: 3.9 g/dL — ABNORMAL LOW (ref 4.0–5.0)
Alkaline Phosphatase: 75 IU/L (ref 39–117)
BUN/Creatinine Ratio: 11 (ref 9–20)
BUN: 11 mg/dL (ref 6–24)
Bilirubin Total: 0.5 mg/dL (ref 0.0–1.2)
CO2: 24 mmol/L (ref 20–29)
Calcium: 8.9 mg/dL (ref 8.7–10.2)
Chloride: 106 mmol/L (ref 96–106)
Creatinine, Ser: 1.01 mg/dL (ref 0.76–1.27)
GFR calc Af Amer: 100 mL/min/{1.73_m2} (ref >59)
GFR calc non Af Amer: 87 mL/min/{1.73_m2} (ref >59)
Globulin, Total: 2.6 g/dL (ref 1.5–4.5)
Glucose: 98 mg/dL (ref 65–99)
Potassium: 4.2 mmol/L (ref 3.5–5.2)
Sodium: 141 mmol/L (ref 134–144)
Total Protein: 6.5 g/dL (ref 6.0–8.5)

## 2019-02-18 LAB — TSH: TSH: 0.365 u[IU]/mL — ABNORMAL LOW (ref 0.450–4.500)

## 2019-02-18 LAB — LIPID PANEL
Chol/HDL Ratio: 4.1 ratio (ref 0.0–5.0)
Cholesterol, Total: 145 mg/dL (ref 100–199)
HDL: 35 mg/dL — ABNORMAL LOW (ref >39)
LDL Chol Calc (NIH): 87 mg/dL (ref 0–99)
Triglycerides: 127 mg/dL (ref 0–149)
VLDL Cholesterol Cal: 23 mg/dL (ref 5–40)

## 2019-02-18 LAB — HIV ANTIBODY (ROUTINE TESTING W REFLEX): HIV Screen 4th Generation wRfx: NONREACTIVE

## 2019-02-18 MED ORDER — AZELASTINE-FLUTICASONE 137-50 MCG/ACT NA SUSP: 1.0000 | Freq: Two times a day (BID) | NASAL | 5 refills | Status: DC

## 2019-02-18 NOTE — Telephone Encounter (Signed)
-----   Message from Trinna Post, [place] sent at 02/18/2019  8:21 AM EDT ----- Can we please add Free T3 and T4 under dx low TSH? Thanks.

## 2019-02-18 NOTE — Telephone Encounter (Signed)
Test added.   

## 2019-02-25 LAB — T4F+T3FREE
Free T-3: 3 pg/mL
Free Thyroxine: 1.13 ng/dL
Thyroxine (T4): 10.2 ug/dL
Triiodothyronine (T-3), Serum: 143 ng/dL

## 2019-02-25 LAB — SPECIMEN STATUS REPORT

## 2019-03-02 ENCOUNTER — Telehealth: Payer: Self-pay

## 2019-03-02 DIAGNOSIS — R7989 Other specified abnormal findings of blood chemistry: Secondary | ICD-10-CM

## 2019-03-02 NOTE — Telephone Encounter (Signed)
Patient has been advised. KW 

## 2019-03-02 NOTE — Telephone Encounter (Signed)
-----   Message from Trinna Post, [place] sent at 02/26/2019  4:55 PM EDT ----- Can we let patient know follow up labs for thyroid are normal. I think we can recheck TSH in one month and if still low, I would recommend referring to endocrinology. Can we plast TSH under dx low TSH? Patient can come on walk in basis.

## 2019-04-13 ENCOUNTER — Other Ambulatory Visit: Payer: Self-pay

## 2019-04-13 DIAGNOSIS — Z20822 Contact with and (suspected) exposure to covid-19: Secondary | ICD-10-CM

## 2019-04-15 LAB — NOVEL CORONAVIRUS, NAA: SARS-CoV-2, NAA: NOT DETECTED

## 2019-06-13 ENCOUNTER — Ambulatory Visit
Admission: EM | Admit: 2019-06-13 | Discharge: 2019-06-13 | Disposition: A | Discharge status: Home / Self Care | Payer: No Typology Code available for payment source | Attending: Family Medicine | Admitting: Family Medicine

## 2019-06-13 ENCOUNTER — Other Ambulatory Visit: Payer: Self-pay

## 2019-06-13 DIAGNOSIS — H5702 Anisocoria: Secondary | ICD-10-CM | POA: Diagnosis not present

## 2019-06-13 DIAGNOSIS — W2103XA Struck by baseball, initial encounter: Secondary | ICD-10-CM

## 2019-06-13 DIAGNOSIS — S058X2A Other injuries of left eye and orbit, initial encounter: Secondary | ICD-10-CM | POA: Diagnosis not present

## 2019-06-13 DIAGNOSIS — S0993XA Unspecified injury of face, initial encounter: Secondary | ICD-10-CM

## 2019-06-13 NOTE — Discharge Instructions (Addendum)
Recommend patient go to Emergency Department for further evaluation and management °
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
## 2019-06-14 MED ORDER — ATROPINE SULFATE 1 % OP SOLN
1.00 | OPHTHALMIC | Status: DC
Start: 2019-06-14 — End: 2019-06-14

## 2019-06-14 MED ORDER — DORZOLAMIDE HCL-TIMOLOL MAL 22.3-6.8 MG/ML OP SOLN
1.00 | OPHTHALMIC | Status: DC
Start: 2019-06-14 — End: 2019-06-14

## 2019-06-14 MED ORDER — CARBOXYMETHYLCELLULOSE SODIUM 0.25 % OP SOLN
1.00 | OPHTHALMIC | Status: DC
Start: 2019-06-14 — End: 2019-06-14

## 2019-06-14 MED ORDER — WH PETROL-MINERAL OIL-LANOLIN 0.1-0.1 % OP OINT
1.00 | TOPICAL_OINTMENT | OPHTHALMIC | Status: DC
Start: 2019-06-14 — End: 2019-06-14

## 2019-06-14 MED ORDER — PREDNISOLONE ACETATE 1 % OP SUSP
1.00 | OPHTHALMIC | Status: DC
Start: 2019-06-14 — End: 2019-06-14

## 2019-06-15 ENCOUNTER — Telehealth: Payer: Self-pay | Admitting: Physician Assistant

## 2019-06-15 ENCOUNTER — Ambulatory Visit (INDEPENDENT_AMBULATORY_CARE_PROVIDER_SITE_OTHER): Payer: No Typology Code available for payment source | Admitting: Family Medicine

## 2019-06-15 ENCOUNTER — Encounter: Payer: Self-pay | Admitting: Family Medicine

## 2019-06-15 ENCOUNTER — Other Ambulatory Visit: Payer: Self-pay

## 2019-06-15 VITALS — BP 150/94 | HR 73 | Temp 95.9°F | Wt 249.0 lb

## 2019-06-15 DIAGNOSIS — H209 Unspecified iridocyclitis: Secondary | ICD-10-CM | POA: Diagnosis not present

## 2019-06-15 DIAGNOSIS — S0292XD Unspecified fracture of facial bones, subsequent encounter for fracture with routine healing: Secondary | ICD-10-CM

## 2019-06-15 MED ORDER — HYDROCODONE-ACETAMINOPHEN 5-325 MG PO TABS
1.0000 | ORAL_TABLET | Freq: Four times a day (QID) | ORAL | 0 refills | Status: AC | PRN
Start: 2019-06-15 — End: 2019-06-20
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
## 2019-06-15 NOTE — Telephone Encounter (Signed)
Appointment was made for Dr B this morning
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
## 2019-06-17 ENCOUNTER — Encounter: Payer: Self-pay | Admitting: Family Medicine

## 2020-02-10 ENCOUNTER — Telehealth: Payer: Self-pay

## 2020-02-10 ENCOUNTER — Encounter (INDEPENDENT_AMBULATORY_CARE_PROVIDER_SITE_OTHER): Payer: Self-pay

## 2020-02-10 ENCOUNTER — Telehealth: Payer: No Typology Code available for payment source | Admitting: Family

## 2020-02-10 ENCOUNTER — Encounter: Payer: Self-pay | Admitting: Physician Assistant

## 2020-02-10 DIAGNOSIS — U071 COVID-19: Secondary | ICD-10-CM

## 2020-02-10 MED ORDER — ALBUTEROL SULFATE HFA 108 (90 BASE) MCG/ACT IN AERS: 2.0000 | INHALATION_SPRAY | Freq: Four times a day (QID) | RESPIRATORY_TRACT | 0 refills | Status: AC | PRN

## 2020-02-10 MED ORDER — BENZONATATE 100 MG PO CAPS: 100.0000 mg | ORAL_CAPSULE | Freq: Three times a day (TID) | ORAL | 0 refills | Status: DC | PRN

## 2020-02-10 NOTE — Telephone Encounter (Signed)
Copied from CRM [id number]. Topic: General - Inquiry >> Feb 10, 2020  3:32 PM Brian Wilcox wrote: Reason for CRM: Patient tested positive for Covid and wants to know what his next steps would be. Does he need a steroid or an antibiotic. He wants a return call as soon as possible. He also has some questions about the infusion. He can be reached at [phone]. Please advise

## 2020-02-10 NOTE — Telephone Encounter (Signed)
Please review. Thanks!  

## 2020-02-10 NOTE — Telephone Encounter (Signed)
Please schedule for virtual visit tomorrow.

## 2020-02-10 NOTE — Progress Notes (Signed)
E-Visit for Corona Virus Screening  We are sorry you are not feeling well. We are here to help!  You have tested positive for COVID-19, meaning that you were infected with the novel coronavirus and could give the germ to others.    You have been enrolled in MyChart Home Monitoring for COVID-19. Daily you will receive a questionnaire within the MyChart website. Our COVID-19 response team will be monitoring your responses daily.  Please continue isolation at home, for at least 10 days since the start of your symptoms and until you have had 24 hours with no fever (without taking a fever reducer) and with improving of symptoms.  Please continue good preventive care measures, including:  frequent hand-washing, avoid touching your face, cover coughs/sneezes, stay out of crowds and keep a 6 foot distance from others.  Follow up with your provider or go to the nearest hospital ED for re-assessment if fever/cough/breathlessness return.  The following symptoms may appear 2-14 days after exposure: . Fever . Cough . Shortness of breath or difficulty breathing . Chills . Repeated shaking with chills . Muscle pain . Headache . Sore throat . New loss of taste or smell . Fatigue . Congestion or runny nose . Nausea or vomiting . Diarrhea  Go to the nearest hospital ED for assessment if fever/cough/breathlessness are severe or illness seems like a threat to life.  It is vitally important that if you feel that you have an infection such as this virus or any other virus that you stay home and away from places where you may spread it to others.  You should avoid contact with people age 65 and older.   You can use medication such as A prescription cough medication called Tessalon Perles 100 mg. You may take 1-2 capsules every 8 hours as needed for cough and A prescription inhaler called Albuterol MDI 90 mcg /actuation 2 puffs every 4 hours as needed for shortness of breath, wheezing, cough  You may also  take acetaminophen (Tylenol) as needed for fever.  Reduce your risk of any infection by using the same precautions used for avoiding the common cold or flu:  . Wash your hands often with soap and warm water for at least 20 seconds.  If soap and water are not readily available, use an alcohol-based hand sanitizer with at least 60% alcohol.  . If coughing or sneezing, cover your mouth and nose by coughing or sneezing into the elbow areas of your shirt or coat, into a tissue or into your sleeve (not your hands). . Avoid shaking hands with others and consider head nods or verbal greetings only. . Avoid touching your eyes, nose, or mouth with unwashed hands.  . Avoid close contact with people who are sick. . Avoid places or events with large numbers of people in one location, like concerts or sporting events. . Carefully consider travel plans you have or are making. . If you are planning any travel outside or inside the [place], visit the CDC's Travelers' Health webpage for the latest health notices. . If you have some symptoms but not all symptoms, continue to monitor at home and seek medical attention if your symptoms worsen. . If you are having a medical emergency, call 911.  HOME CARE . Only take medications as instructed by your medical team. . Drink plenty of fluids and get plenty of rest. . A steam or ultrasonic humidifier can help if you have congestion.   GET HELP RIGHT AWAY IF YOU HAVE   EMERGENCY WARNING SIGNS** FOR COVID-19. If you or someone is showing any of these signs seek emergency medical care immediately. Call 911 or proceed to your closest emergency facility if: . You develop worsening high fever. . Trouble breathing . Bluish lips or face . Persistent pain or pressure in the chest . New confusion . Inability to wake or stay awake . You cough up blood. . Your symptoms become more severe  **This list is not all possible symptoms. Contact your medical provider for any symptoms that  are sever or concerning to you.  MAKE SURE YOU   Understand these instructions.  Will watch your condition.  Will get help right away if you are not doing well or get worse.  Your e-visit answers were reviewed by a board certified advanced clinical practitioner to complete your personal care plan.  Depending on the condition, your plan could have included both over the counter or prescription medications.  If there is a problem please reply once you have received a response from your provider.  Your safety is important to us.  If you have drug allergies check your prescription carefully.    You can use MyChart to ask questions about today's visit, request a non-urgent call back, or ask for a work or school excuse for 24 hours related to this e-Visit. If it has been greater than 24 hours you will need to follow up with your provider, or enter a new e-Visit to address those concerns. You will get an e-mail in the next two days asking about your experience.  I hope that your e-visit has been valuable and will speed your recovery. Thank you for using e-visits.   Approximately 5 minutes was spent documenting and reviewing patient's chart.      

## 2020-02-11 NOTE — Telephone Encounter (Signed)
lmtcb . KW 

## 2020-02-12 ENCOUNTER — Telehealth: Payer: Self-pay

## 2020-02-12 ENCOUNTER — Encounter (INDEPENDENT_AMBULATORY_CARE_PROVIDER_SITE_OTHER): Payer: Self-pay

## 2020-02-12 ENCOUNTER — Telehealth (INDEPENDENT_AMBULATORY_CARE_PROVIDER_SITE_OTHER): Payer: HRSA Program | Admitting: Physician Assistant

## 2020-02-12 DIAGNOSIS — U071 COVID-19: Secondary | ICD-10-CM

## 2020-02-12 NOTE — Telephone Encounter (Signed)
Spoke with patient who states that his vomiting was yesterday and connected to using his albuterol inhaler.  He states he is eating and drinking today and feels much better. He has a scheduled time to speak with Osvaldo Angst today. He is considering doing the infusion treatment. I reviewed S/S of dehydration and informed him if vomiting occurs again and he vomits more that 6 times a day, can not hold down liquids and or experiences abdominal pain he should seek help. Call 911 or go to nearest ER. He verbalized understanding.

## 2020-02-12 NOTE — Progress Notes (Signed)
MyChart Video Visit    Virtual Visit via Video Note   This visit type was conducted due to national recommendations for restrictions regarding the COVID-19 Pandemic (e.g. social distancing) in an effort to limit this patient's exposure and mitigate transmission in our community. This patient is at least at moderate risk for complications without adequate follow up. This format is felt to be most appropriate for this patient at this time. Physical exam was limited by quality of the video and audio technology used for the visit.   Patient location: Home Provider location: Office  I discussed the limitations of evaluation and management by telemedicine and the availability of in person appointments. The patient expressed understanding and agreed to proceed.  Patient: Brian Wilcox   DOB: Nov 25, 1970   49 y.o. Male  MRN: [id number] Visit Date: 02/12/2020  Today's healthcare provider: Trey Sailors, PA-C   Chief Complaint  Patient presents with  . Covid Positive  I,Williams Dietrick M Danice Dippolito,acting as a scribe for Trey Sailors, PA-C.,have documented all relevant documentation on the behalf of Trey Sailors, PA-C,as directed by  Trey Sailors, PA-C while in the presence of Trey Sailors, PA-C.  Subjective    HPI  Covid Positive Patient reports he tested positive for covid on 02/09/2020. He reports that he have cough, congestion, sinus pressure and fatigue. He reports symptoms started last Thursday, 02/04/20. Patient reports he had nausea and vomiting after using his inhaler last night. He denies any SOB, diarrhea, headaches, appetite changes, chills, fever, sore throat. Patient reports that he have appointment for the infusion today but would like to know if it is a good choice.    Medications: Outpatient Medications Prior to Visit  Medication Sig  . albuterol (VENTOLIN HFA) 108 (90 Base) MCG/ACT inhaler Inhale 2 puffs into the lungs every 6 (six) hours as needed for wheezing  or shortness of breath.  . Azelastine-Fluticasone 137-50 MCG/ACT SUSP Place 1 spray into the nose 2 (two) times daily.  . benzonatate (TESSALON PERLES) 100 MG capsule Take 1 capsule (100 mg total) by mouth 3 (three) times daily as needed.  . fexofenadine (ALLEGRA) 180 MG tablet Take 180 mg by mouth daily.  Marland Kitchen HYDROcodone-homatropine (HYCODAN) 5-1.5 MG/5ML syrup Take 5 mLs by mouth every 6 (six) hours as needed for cough.  Marland Kitchen ipratropium (ATROVENT) 0.03 % nasal spray Place 2 sprays into both nostrils 4 (four) times daily.  Marland Kitchen loratadine-pseudoephedrine (CLARITIN-D 24-HOUR) 10-240 MG 24 hr tablet Take 1 tablet by mouth daily.  . Multiple Vitamin (MULTIVITAMIN) tablet Take 1 tablet by mouth daily.   No facility-administered medications prior to visit.    Review of Systems  Constitutional: Positive for fatigue. Negative for appetite change, chills and fever.  HENT: Positive for congestion and sinus pressure. Negative for rhinorrhea, sinus pain, sneezing, sore throat and trouble swallowing.   Respiratory: Positive for cough.   Gastrointestinal: Positive for nausea and vomiting. Negative for diarrhea.  Neurological: Negative for weakness and headaches.      Objective    There were no vitals taken for this visit.   Physical Exam Constitutional:      Appearance: Normal appearance.  Pulmonary:     Effort: Pulmonary effort is normal. No respiratory distress.  Neurological:     Mental Status: He is alert.  Psychiatric:        Mood and Affect: Mood normal.        Behavior: Behavior normal.  Assessment & Plan    1. COVID-19  Discussed treatment, advised [facility] infusion.   Return if symptoms worsen or fail to improve.     I discussed the assessment and treatment plan with the patient. The patient was provided an opportunity to ask questions and all were answered. The patient agreed with the plan and demonstrated an understanding of the instructions.   The patient was advised to  call back or seek an in-person evaluation if the symptoms worsen or if the condition fails to improve as anticipated.   ITrey Sailors, PA-C, have reviewed all documentation for this visit. The documentation on 02/16/20 for the exam, diagnosis, procedures, and orders are all accurate and complete.  The entirety of the information documented in the History of Present Illness, Review of Systems and Physical Exam were personally obtained by me. Portions of this information were initially documented by Anson [place], CMA and reviewed by me for thoroughness and accuracy.   I spent 20 minutes dedicated to the care of this patient on the date of this encounter to include pre-visit review of records, face-to-face time with the patient discussing COVID 19 treatment, and post visit ordering of testing.   Maryella Shivers [facility] [phone] (phone) [phone] (fax)  [facility]

## 2020-02-12 NOTE — Telephone Encounter (Signed)
Send patient a MyChart message to let me know if he can make appointment today.

## 2020-02-15 ENCOUNTER — Encounter (INDEPENDENT_AMBULATORY_CARE_PROVIDER_SITE_OTHER): Payer: Self-pay

## 2020-02-16 ENCOUNTER — Encounter (INDEPENDENT_AMBULATORY_CARE_PROVIDER_SITE_OTHER): Payer: Self-pay

## 2020-02-17 ENCOUNTER — Encounter (INDEPENDENT_AMBULATORY_CARE_PROVIDER_SITE_OTHER): Payer: Self-pay

## 2020-02-22 ENCOUNTER — Encounter (INDEPENDENT_AMBULATORY_CARE_PROVIDER_SITE_OTHER): Payer: Self-pay

## 2020-04-27 ENCOUNTER — Encounter: Payer: Self-pay | Admitting: Physician Assistant

## 2020-04-29 ENCOUNTER — Telehealth: Payer: Self-pay

## 2020-04-29 DIAGNOSIS — J309 Allergic rhinitis, unspecified: Secondary | ICD-10-CM

## 2020-04-29 MED ORDER — AZELASTINE-FLUTICASONE 137-50 MCG/ACT NA SUSP: 1.0000 | Freq: Two times a day (BID) | NASAL | 0 refills | Status: DC

## 2020-04-29 NOTE — Telephone Encounter (Signed)
Patient send a message via MyChart requesting his nasal spray to be send to a different pharmacy ([facility] in [place]).

## 2020-05-04 MED ORDER — AZELASTINE HCL 0.1 % NA SOLN: 2.0000 | Freq: Two times a day (BID) | NASAL | 12 refills | Status: AC

## 2020-05-04 MED ORDER — FLUTICASONE PROPIONATE 50 MCG/ACT NA SUSP: 2.0000 | Freq: Every day | NASAL | 6 refills | Status: DC

## 2020-07-01 ENCOUNTER — Encounter: Payer: Self-pay | Admitting: Physician Assistant

## 2020-07-04 NOTE — Telephone Encounter (Signed)
Needs OV for eval

## 2021-05-12 ENCOUNTER — Telehealth: Payer: Self-pay | Admitting: Physician Assistant

## 2021-05-12 ENCOUNTER — Other Ambulatory Visit: Payer: Self-pay | Admitting: Physician Assistant

## 2021-05-12 DIAGNOSIS — J069 Acute upper respiratory infection, unspecified: Secondary | ICD-10-CM

## 2021-05-12 MED ORDER — FLUTICASONE PROPIONATE 50 MCG/ACT NA SUSP: 2.0000 | Freq: Every day | NASAL | 0 refills | Status: AC

## 2021-05-12 MED ORDER — BENZONATATE 100 MG PO CAPS: 100.0000 mg | ORAL_CAPSULE | Freq: Three times a day (TID) | ORAL | 0 refills | Status: AC | PRN

## 2021-05-12 NOTE — Progress Notes (Signed)

## 2021-05-12 NOTE — Progress Notes (Signed)
I have spent 5 minutes in review of e-visit questionnaire, review and updating patient chart, medical decision making and response to patient.   Xariah Silvernail Cody Deone Leifheit, PA-C
